# Patient Record
Sex: Male | Born: 2005 | Marital: Single | Smoking: Current every day smoker
Health system: Southern US, Community
[De-identification: ages and names within clinical notes are randomized; demographics above are authoritative.]

## PROBLEM LIST (undated history)

## (undated) HISTORY — PX: MYRINGOTOMY: SUR874

---
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
## 2023-02-28 DIAGNOSIS — R070 Pain in throat: Secondary | ICD-10-CM | POA: Diagnosis not present

## 2023-02-28 DIAGNOSIS — Z20822 Contact with and (suspected) exposure to covid-19: Secondary | ICD-10-CM | POA: Diagnosis not present

## 2023-05-06 DIAGNOSIS — J029 Acute pharyngitis, unspecified: Secondary | ICD-10-CM | POA: Diagnosis not present

## 2023-05-06 DIAGNOSIS — R519 Headache, unspecified: Secondary | ICD-10-CM | POA: Diagnosis not present

## 2023-05-06 DIAGNOSIS — J019 Acute sinusitis, unspecified: Secondary | ICD-10-CM | POA: Diagnosis not present

## 2023-05-06 DIAGNOSIS — Z20822 Contact with and (suspected) exposure to covid-19: Secondary | ICD-10-CM | POA: Diagnosis not present

## 2023-06-10 DIAGNOSIS — K0889 Other specified disorders of teeth and supporting structures: Secondary | ICD-10-CM | POA: Diagnosis not present

## 2024-01-06 DIAGNOSIS — L239 Allergic contact dermatitis, unspecified cause: Secondary | ICD-10-CM | POA: Diagnosis not present
